# Patient Record
Sex: Female | Born: 1945 | Race: White | Hispanic: No | Smoking: Former smoker
Health system: Southern US, Community
[De-identification: ages and names within clinical notes are randomized; demographics above are authoritative.]

## PROBLEM LIST (undated history)

## (undated) DIAGNOSIS — M069 Rheumatoid arthritis, unspecified: Secondary | ICD-10-CM

## (undated) HISTORY — PX: APPENDECTOMY: SHX54

## (undated) HISTORY — PX: ABDOMINAL HYSTERECTOMY: SHX81

---

## 2020-01-07 ENCOUNTER — Emergency Department (HOSPITAL_COMMUNITY): Payer: Medicare HMO

## 2020-01-07 ENCOUNTER — Encounter (HOSPITAL_COMMUNITY): Payer: Self-pay

## 2020-01-07 ENCOUNTER — Other Ambulatory Visit: Payer: Self-pay

## 2020-01-07 ENCOUNTER — Emergency Department (HOSPITAL_COMMUNITY)
Admission: EM | Admit: 2020-01-07 | Discharge: 2020-01-07 | Disposition: A | Discharge status: Home / Self Care | Payer: Medicare HMO | Attending: Emergency Medicine | Admitting: Emergency Medicine

## 2020-01-07 DIAGNOSIS — Z853 Personal history of malignant neoplasm of breast: Secondary | ICD-10-CM | POA: Insufficient documentation

## 2020-01-07 DIAGNOSIS — Z87891 Personal history of nicotine dependence: Secondary | ICD-10-CM | POA: Insufficient documentation

## 2020-01-07 DIAGNOSIS — K5900 Constipation, unspecified: Secondary | ICD-10-CM | POA: Diagnosis not present

## 2020-01-07 DIAGNOSIS — R109 Unspecified abdominal pain: Secondary | ICD-10-CM | POA: Diagnosis present

## 2020-01-07 DIAGNOSIS — M7981 Nontraumatic hematoma of soft tissue: Secondary | ICD-10-CM | POA: Diagnosis not present

## 2020-01-07 DIAGNOSIS — R110 Nausea: Secondary | ICD-10-CM | POA: Insufficient documentation

## 2020-01-07 HISTORY — DX: Rheumatoid arthritis, unspecified: M06.9

## 2020-01-07 LAB — URINALYSIS, ROUTINE W REFLEX MICROSCOPIC
Bilirubin Urine: NEGATIVE
Glucose, UA: NEGATIVE mg/dL
Hgb urine dipstick: NEGATIVE
Ketones, ur: NEGATIVE mg/dL
Leukocytes,Ua: NEGATIVE
Nitrite: NEGATIVE
Protein, ur: NEGATIVE mg/dL
Specific Gravity, Urine: 1.032 — ABNORMAL HIGH (ref 1.005–1.030)
pH: 6 (ref 5.0–8.0)

## 2020-01-07 LAB — COMPREHENSIVE METABOLIC PANEL
ALT: 18 U/L (ref 0–44)
AST: 45 U/L — ABNORMAL HIGH (ref 15–41)
Albumin: 4.1 g/dL (ref 3.5–5.0)
Alkaline Phosphatase: 167 U/L — ABNORMAL HIGH (ref 38–126)
Anion gap: 8 (ref 5–15)
BUN: 12 mg/dL (ref 8–23)
CO2: 25 mmol/L (ref 22–32)
Calcium: 9.3 mg/dL (ref 8.9–10.3)
Chloride: 97 mmol/L — ABNORMAL LOW (ref 98–111)
Creatinine, Ser: 0.57 mg/dL (ref 0.44–1.00)
GFR, Estimated: >60 mL/min (ref >60)
Glucose, Bld: 101 mg/dL — ABNORMAL HIGH (ref 70–99)
Potassium: 3.8 mmol/L (ref 3.5–5.1)
Sodium: 130 mmol/L — ABNORMAL LOW (ref 135–145)
Total Bilirubin: 0.4 mg/dL (ref 0.3–1.2)
Total Protein: 7.1 g/dL (ref 6.5–8.1)

## 2020-01-07 LAB — CBC WITH DIFFERENTIAL/PLATELET
Abs Immature Granulocytes: 0.11 10*3/uL — ABNORMAL HIGH (ref 0.00–0.07)
Basophils Absolute: 0.1 10*3/uL (ref 0.0–0.1)
Basophils Relative: 1 %
Eosinophils Absolute: 0 10*3/uL (ref 0.0–0.5)
Eosinophils Relative: 0 %
HCT: 38.8 % (ref 36.0–46.0)
Hemoglobin: 13.1 g/dL (ref 12.0–15.0)
Immature Granulocytes: 1 %
Lymphocytes Relative: 8 %
Lymphs Abs: 0.8 10*3/uL (ref 0.7–4.0)
MCH: 31.3 pg (ref 26.0–34.0)
MCHC: 33.8 g/dL (ref 30.0–36.0)
MCV: 92.8 fL (ref 80.0–100.0)
Monocytes Absolute: 0.7 10*3/uL (ref 0.1–1.0)
Monocytes Relative: 7 %
Neutro Abs: 8.9 10*3/uL — ABNORMAL HIGH (ref 1.7–7.7)
Neutrophils Relative %: 83 %
Platelets: 351 10*3/uL (ref 150–400)
RBC: 4.18 MIL/uL (ref 3.87–5.11)
RDW: 14.9 % (ref 11.5–15.5)
WBC: 10.6 10*3/uL — ABNORMAL HIGH (ref 4.0–10.5)
nRBC: 0 % (ref 0.0–0.2)

## 2020-01-07 LAB — LIPASE, BLOOD: Lipase: 26 U/L (ref 11–51)

## 2020-01-07 MED ORDER — OXYCODONE-ACETAMINOPHEN 5-325 MG PO TABS
1.0000 | ORAL_TABLET | Freq: Once | ORAL | Status: AC
  Administered 2020-01-07: 1 via ORAL
  Filled 2020-01-07: qty 1

## 2020-01-07 MED ORDER — ONDANSETRON HCL 4 MG/2ML IJ SOLN
4.0000 mg | Freq: Once | INTRAMUSCULAR | Status: AC
  Administered 2020-01-07: 4 mg via INTRAVENOUS
  Filled 2020-01-07: qty 2

## 2020-01-07 MED ORDER — ONDANSETRON 4 MG PO TBDP: 4.0000 mg | ORAL_TABLET | Freq: Three times a day (TID) | ORAL | 0 refills | Status: AC | PRN

## 2020-01-07 MED ORDER — ONDANSETRON 4 MG PO TBDP: 4.0000 mg | ORAL_TABLET | Freq: Once | ORAL | Status: DC

## 2020-01-07 MED ORDER — MORPHINE SULFATE (PF) 4 MG/ML IV SOLN
4.0000 mg | Freq: Once | INTRAVENOUS | Status: AC
  Administered 2020-01-07: 4 mg via INTRAVENOUS
  Filled 2020-01-07: qty 1

## 2020-01-07 MED ORDER — OXYCODONE HCL 5 MG PO TABS
5.0000 mg | ORAL_TABLET | Freq: Four times a day (QID) | ORAL | 0 refills | Status: AC | PRN
Start: 2020-01-07 — End: 2020-01-22

## 2020-01-07 MED ORDER — ACETAMINOPHEN 325 MG PO TABS: 650.0000 mg | ORAL_TABLET | Freq: Four times a day (QID) | ORAL | 0 refills | Status: AC | PRN

## 2020-01-07 MED ORDER — IOHEXOL 350 MG/ML SOLN
75.0000 mL | Freq: Once | INTRAVENOUS | Status: AC | PRN
  Administered 2020-01-07: 75 mL via INTRAVENOUS

## 2020-01-07 MED ORDER — SENNOSIDES-DOCUSATE SODIUM 8.6-50 MG PO TABS: 1.0000 | ORAL_TABLET | Freq: Every day | ORAL | 0 refills | Status: AC

## 2020-01-07 NOTE — ED Provider Notes (Signed)
[facility] EMERGENCY DEPARTMENT Provider Note   CSN: [id number] Arrival date & time: 01/07/20  1522     History Chief Complaint  Patient presents with  . Abdominal Pain    Heather Hughes is a 74 y.o. female with history of stage III breast cancer status post mastectomy, on oral hormone therapy, history of appendectomy and abdominal hysterectomy, presenting to emergency department abdominal pain.  Patient reports gradual onset of abdominal pain approximately 3 weeks ago.  The pain does wax and wane, at times goes away completely, but seems to return every single day.  It is worsened with movements, particularly with sitting up and with walking.  Nothing makes it better, including Tylenol or Motrin at home.  The patient denies to me that she has had any diarrhea, reports that she has some chronic constipation issues, but has been having regular bowel movements this week.  She denies ever having this kind of abdominal pain before.  Currently the pain is a 4 out of 10 while lying still, but becomes significantly worse when he tried to sit up or move.  She reports she suffers from urinary incontinence, but denies history of recurrent urine infections or kidney stones.  Her daughter at bedside who is a phlebotomist reports that she is most concerned that there may be malignancy that has metastasized to the abdomen.  She says her mother had breast cancer with lymph node involvement in the axilla, and did have cancer screening performed earlier this year, which was "reassuring".  Her oncologist is in [place], where the patient lives.  No fevers, chills, vomiting at home.  + Nausea.  HPI     Past Medical History:  Diagnosis Date  . Rheumatoid arthritis ([place])     There are no problems to display for this patient.   Past Surgical History:  Procedure Laterality Date  . ABDOMINAL HYSTERECTOMY    . APPENDECTOMY       OB History   No obstetric history on file.     History  reviewed. No pertinent family history.  Social History   Tobacco Use  . Smoking status: Former Smoker    Types: Cigarettes    Quit date: 02/12/1996    Years since quitting: 23.9  . Smokeless tobacco: Never Used  Substance Use Topics  . Alcohol use: Yes  . Drug use: Never    Home Medications Prior to Admission medications   Medication Sig Start Date End Date Taking? Authorizing Provider  acetaminophen (TYLENOL) 325 MG tablet Take 2 tablets (650 mg total) by mouth every 6 (six) hours as needed for up to 30 doses for mild pain or moderate pain. 01/07/20   Wyvonnia Dusky, MD  ondansetron (ZOFRAN ODT) 4 MG disintegrating tablet Take 1 tablet (4 mg total) by mouth every 8 (eight) hours as needed for up to 15 doses for nausea or vomiting. 01/07/20   Wyvonnia Dusky, MD  oxyCODONE (ROXICODONE) 5 MG immediate release tablet Take 1 tablet (5 mg total) by mouth every 6 (six) hours as needed for up to 15 days for severe pain. 01/07/20 01/22/20  Wyvonnia Dusky, MD  senna-docusate (SENOKOT-S) 8.6-50 MG tablet Take 1 tablet by mouth daily for 30 doses. 01/07/20 02/06/20  Wyvonnia Dusky, MD    Allergies    Patient has no known allergies.  Review of Systems   Review of Systems  Constitutional: Positive for appetite change and fatigue. Negative for fever.  HENT: Negative for ear pain and sore  throat.   Eyes: Negative for photophobia and visual disturbance.  Respiratory: Negative for cough and shortness of breath.   Cardiovascular: Negative for chest pain and palpitations.  Gastrointestinal: Positive for abdominal pain and nausea. Negative for blood in stool, constipation, diarrhea and vomiting.  Genitourinary: Negative for dysuria and hematuria.  Musculoskeletal: Negative for arthralgias and back pain.  Skin: Negative for color change and rash.  Neurological: Negative for syncope and light-headedness.  All other systems reviewed and are negative.   Physical Exam Updated Vital  Signs BP (!) 145/77   Pulse 85   Temp 98.2 F (36.8 C) (Oral)   Resp 17   Ht 5' 3" (1.6 m)   Wt 42.9 kg   SpO2 98%   BMI 16.74 kg/m   Physical Exam Vitals and nursing note reviewed.  Constitutional:      General: She is not in acute distress.    Comments: Thin, frail appearing  HENT:     Head: Normocephalic and atraumatic.  Eyes:     Conjunctiva/sclera: Conjunctivae normal.  Cardiovascular:     Rate and Rhythm: Normal rate and regular rhythm.     Heart sounds: Normal heart sounds.  Pulmonary:     Effort: Pulmonary effort is normal. No respiratory distress.     Breath sounds: Normal breath sounds.  Abdominal:     Palpations: Abdomen is soft.     Tenderness: There is abdominal tenderness in the right upper quadrant and epigastric area. There is no right CVA tenderness, left CVA tenderness, guarding or rebound. Negative signs include Murphy's sign.  Musculoskeletal:     Cervical back: Neck supple.  Skin:    General: Skin is warm and dry.     Comments: S/p mastectomy  Neurological:     General: No focal deficit present.     Mental Status: She is alert and oriented to person, place, and time.     Comments: No spinal midline tenderness on back exam Mild right CVA ttp     ED Results / Procedures / Treatments   Labs (all labs ordered are listed, but only abnormal results are displayed) Labs Reviewed  COMPREHENSIVE METABOLIC PANEL - Abnormal; Notable for the following components:      Result Value   Sodium 130 (*)    Chloride 97 (*)    Glucose, Bld 101 (*)    AST 45 (*)    Alkaline Phosphatase 167 (*)    All other components within normal limits  CBC WITH DIFFERENTIAL/PLATELET - Abnormal; Notable for the following components:   WBC 10.6 (*)    Neutro Abs 8.9 (*)    Abs Immature Granulocytes 0.11 (*)    All other components within normal limits  URINALYSIS, ROUTINE W REFLEX MICROSCOPIC - Abnormal; Notable for the following components:   Color, Urine STRAW (*)     Specific Gravity, Urine 1.032 (*)    All other components within normal limits  LIPASE, BLOOD    EKG None  Radiology DG Chest 2 View  Result Date: 01/07/2020 CLINICAL DATA:  74 year old female with pneumonia. EXAM: CHEST - 2 VIEW COMPARISON:  CT abdomen pelvis dated 01/07/2020. FINDINGS: No focal consolidation, pleural effusion, pneumothorax. The cardiac silhouette is within limits. Atherosclerotic calcification of the aorta. Osteopenia with degenerative changes of the spine. Old left posterior rib fractures. No acute osseous pathology. IMPRESSION: No active cardiopulmonary disease. Electronically Signed   By: Anner Crete M.D.   On: 01/07/2020 18:11   CT Angio Abd/Pel W and/or Wo  Contrast  Result Date: 01/07/2020 CLINICAL DATA:  Abdominal pain and nausea with back pain. EXAM: CTA ABDOMEN AND PELVIS WITH CONTRAST TECHNIQUE: Multidetector CT imaging of the abdomen and pelvis was performed using the standard protocol during bolus administration of intravenous contrast. Multiplanar reconstructed images and MIPs were obtained and reviewed to evaluate the vascular anatomy. CONTRAST:  90m OMNIPAQUE IOHEXOL 350 MG/ML SOLN COMPARISON:  None. FINDINGS: VASCULAR Aorta: Considerable atherosclerosis. No dissection or well-defined acute extravasation of contrast to suggest a leak, but there is some indistinctness of tissue planes in the retroperitoneum especially along the aortocaval region. Some of this is partially attributable to the low-density in the cava causing indistinct margins. There does appear to be thickening and possible hematoma tracking along the right psoas muscle and along the right perirenal fascia and potentially in the retroperitoneum. Celiac: Patent. Atherosclerotic calcification of the splenic artery. SMA: Patent Renals: 2 left and single right renal arteries appear patent. IMA: Patent. Inflow: Atherosclerotic calcification without critical stenosis. Proximal Outflow: Atherosclerosis  without significant stenosis. Veins: IVC is somewhat indistinct. No obvious pelvic DVT although the veins are very poorly opacified on these arterial phase images. Review of the MIP images confirms the above findings. NON-VASCULAR Lower chest: Scarring or atelectasis in the lingula and right middle lobe. Hepatobiliary: Unremarkable Pancreas: Unremarkable Spleen: Unremarkable Adrenals/Urinary Tract: Mild right hydronephrosis. No hydroureter, query chronic right UPJ narrowing. Mild thickening along the posterior perirenal fascia Stomach/Bowel: Unremarkable Lymphatic: No pathologic adenopathy identified. Reproductive: Obscured by streak artifact from the hip hardware. Other: Trace ascites along the paracolic gutters. Edema tracks along the retroperitoneum and perirenal fascia. Musculoskeletal: Bony demineralization. Left hip ORIF and right hip prosthesis. Old healed pelvic fractures. Scattered sclerotic lesions in the skeleton, suspicious for osseous metastatic disease, correlate with patient history. These are notable for example in the T12 and L3 vertebral bodies and in the posterior elements of L2. As well as scattered in the bony pelvis. IMPRESSION: 1. No dissection or well-defined acute extravasation of contrast to suggest a leak, but there is some indistinctness of tissue planes in the retroperitoneum especially along the aortocaval region, and also in the right perirenal fascia. There is also some thickening and possible hematoma tracking along the right psoas muscle and along the right perirenal fascia. Given the mild thickening along the right psoas muscle I cannot exclude a small psoas hematoma. 2. Mild right hydronephrosis without hydroureter, query chronic right UPJ narrowing. 3. Scattered sclerotic lesions in the skeleton, suspicious for widespread osseous metastatic disease. 4. Trace ascites along the paracolic gutters. 5. Bony demineralization. 6. Aortic atherosclerosis. Aortic Atherosclerosis  (ICD10-I70.0). Electronically Signed   By: WVan ClinesM.D.   On: 01/07/2020 18:07    Procedures Procedures (including critical care time)  Medications Ordered in ED Medications  ondansetron (ZOFRAN-ODT) disintegrating tablet 4 mg (has no administration in time range)  morphine 4 MG/ML injection 4 mg (4 mg Intravenous Given 01/07/20 1642)  ondansetron (ZOFRAN) injection 4 mg (4 mg Intravenous Given 01/07/20 1641)  iohexol (OMNIPAQUE) 350 MG/ML injection 75 mL (75 mLs Intravenous Contrast Given 01/07/20 1720)  oxyCODONE-acetaminophen (PERCOCET/ROXICET) 5-325 MG per tablet 1 tablet (1 tablet Oral Given 01/07/20 2052)    ED Course  I have reviewed the triage vital signs and the nursing notes.  Pertinent labs & imaging results that were available during my care of the patient were reviewed by me and considered in my medical decision making (see chart for details).  This patient presents to the Emergency Department with complaint  of abdominal pain and back pain. This involves an extensive number of treatment options, and is a complaint that carries with it a high risk of complications and morbidity.  The differential diagnosis includes gastritis vs biliary disease vs metastatic lesions vs aortic pathology vs constipation vs inflammatory bowel disease vs intraabdominal infection vs other  S/p appendectomy & hysterectomy  I ordered, reviewed, and interpreted labs, including lipase w6, CMP largely unremarkable, CBC with WBC 10.6, hgb 13.1.  Alk phos 167 today, consistent with possible bony mets seen on CT imaging. I ordered medication IV morphine and IV zofran for abdominal pain and/or nausea I ordered imaging studies which included dg chest and ct abd I independently visualized and interpreted imaging which showed no acute pulm process, CT findings as noted, and the monitor tracing which showed NSR Additional history was obtained from daughter at bedside  After the interventions stated  above, I reevaluated the patient and found that they remained clinically stable.  I discussed CT findings with patient and daughter.   Suspect back pain may be from psoas hematoma (possible due to unwitting trauma 3 weeks ago?) vs bony mets to spine.  Advised them to f/u with oncologist.  Offered some pain medications for home.  She's been on oxycodone recently for an arm fracture and seems to tolerate it well at home.   Final Clinical Impression(s) / ED Diagnoses Final diagnoses:  Nontraumatic psoas hematoma    Rx / DC Orders ED Discharge Orders         Ordered    oxyCODONE (ROXICODONE) 5 MG immediate release tablet  Every 6 hours PRN        01/07/20 2041    acetaminophen (TYLENOL) 325 MG tablet  Every 6 hours PRN        01/07/20 2041    senna-docusate (SENOKOT-S) 8.6-50 MG tablet  Daily        01/07/20 2041    ondansetron (ZOFRAN ODT) 4 MG disintegrating tablet  Every 8 hours PRN        01/07/20 2041           Wyvonnia Dusky, MD 01/08/20 [id number]

## 2020-01-07 NOTE — ED Triage Notes (Signed)
Pt to er, pt states that for the past three weeks she has been having some L flank pain that radiates into her back and abd.  States that it is a constant pain.  States that she had her blood drawn about 4 weeks ago and had some elevated liver enzymes.

## 2020-01-07 NOTE — Discharge Instructions (Addendum)
Your CT scan today showed that you have a hematoma, or collection of blood, long one of the muscles in your back.  This may be causing some of your back pain.  However your CT scan also showed that you have some "lesions in your spine."  There is some concern that this may be cancer.  I recommended that you follow-up with your oncologist, or if you are interested in transferring, call the number above to establish care with one of our oncologist.  Oxycodone can cause constipation.  While you are taking oxycodone, you can also take Senokot for constipation.

## 2020-03-14 DEATH — deceased

## 2021-10-30 IMAGING — DX DG CHEST 2V
2 series · 2 of 2 positions shown · non-contrast
Comparison: CT abdomen pelvis dated 01/07/2020.

CLINICAL DATA: 74-year-old female with pneumonia.

EXAM:
CHEST - 2 VIEW

[chest lat]
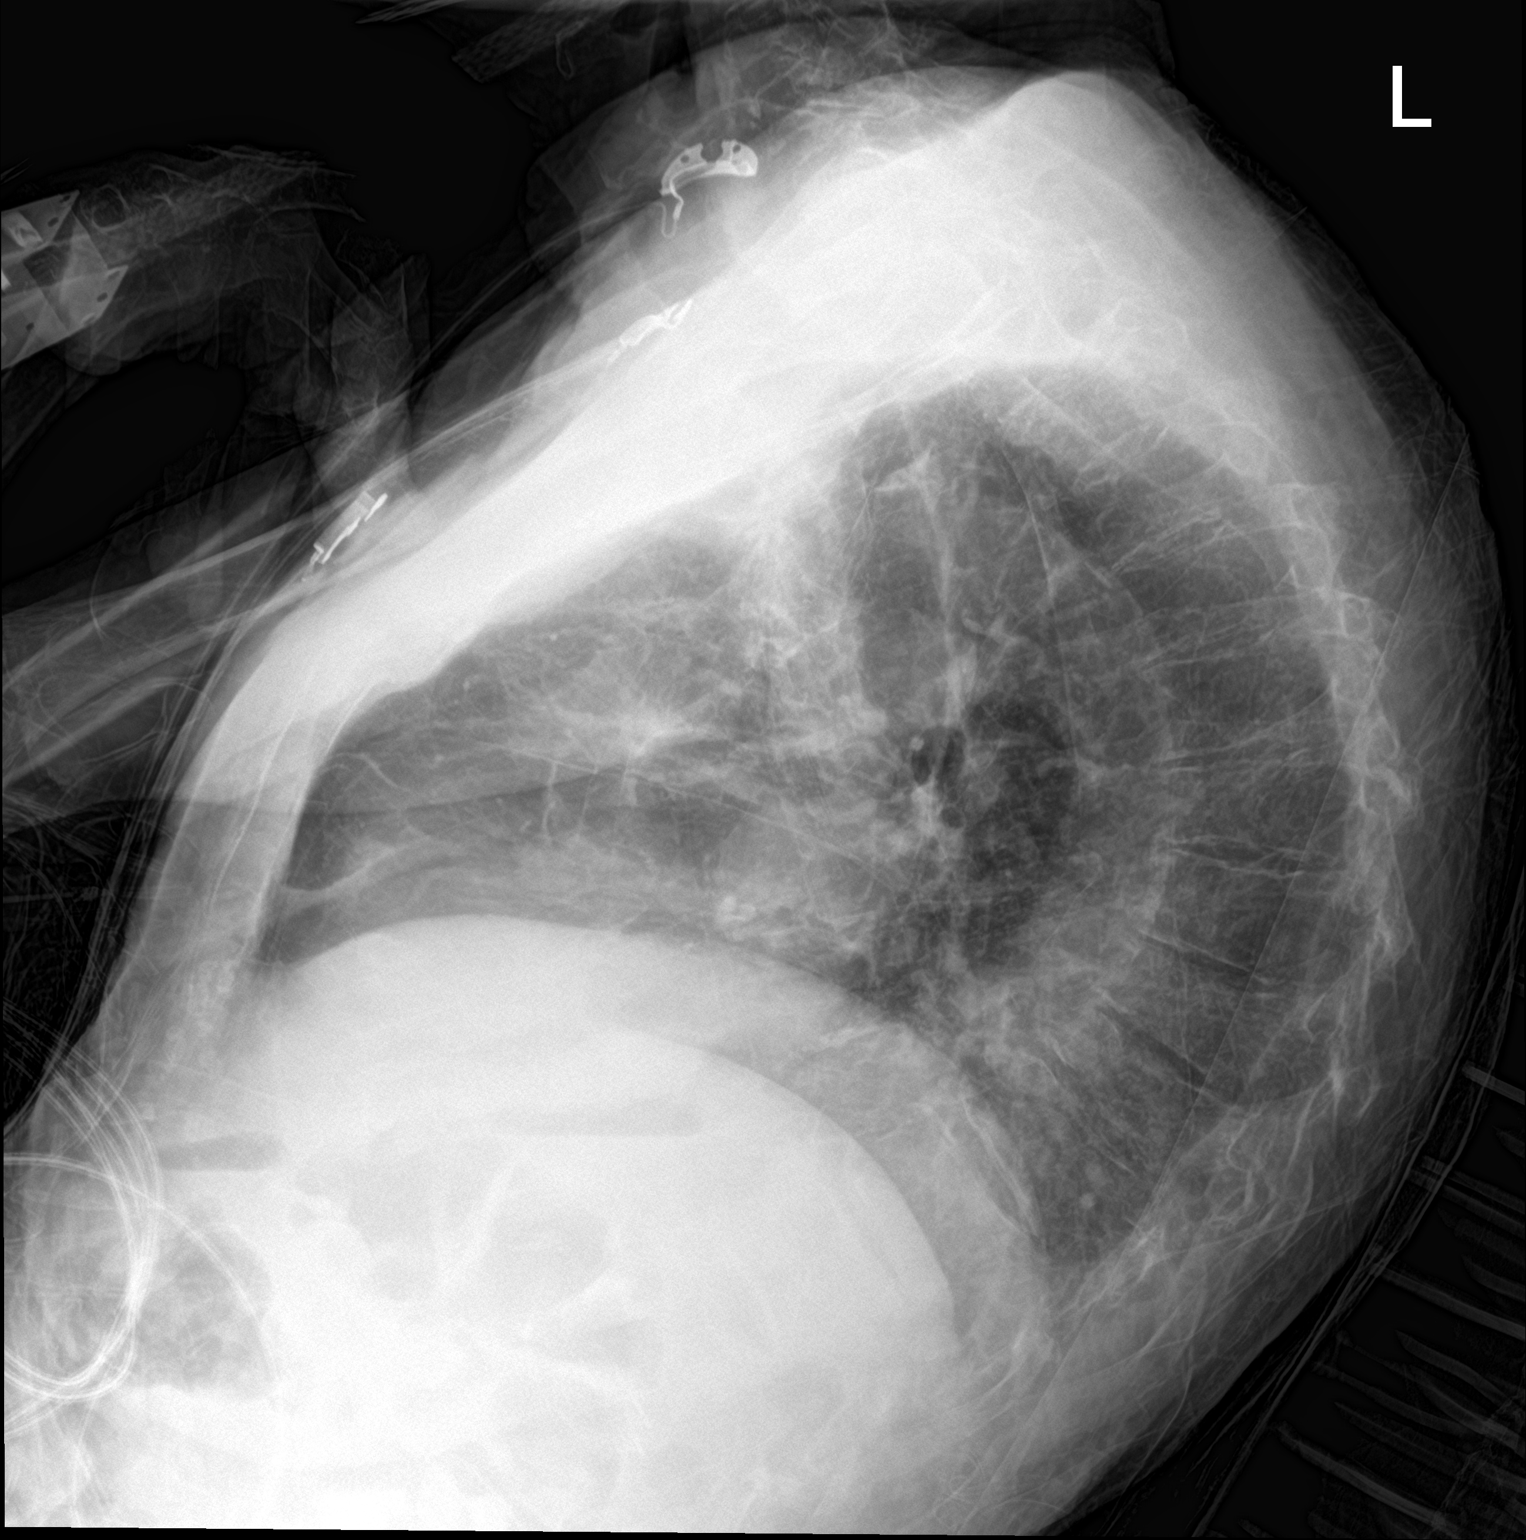

[chest ap]
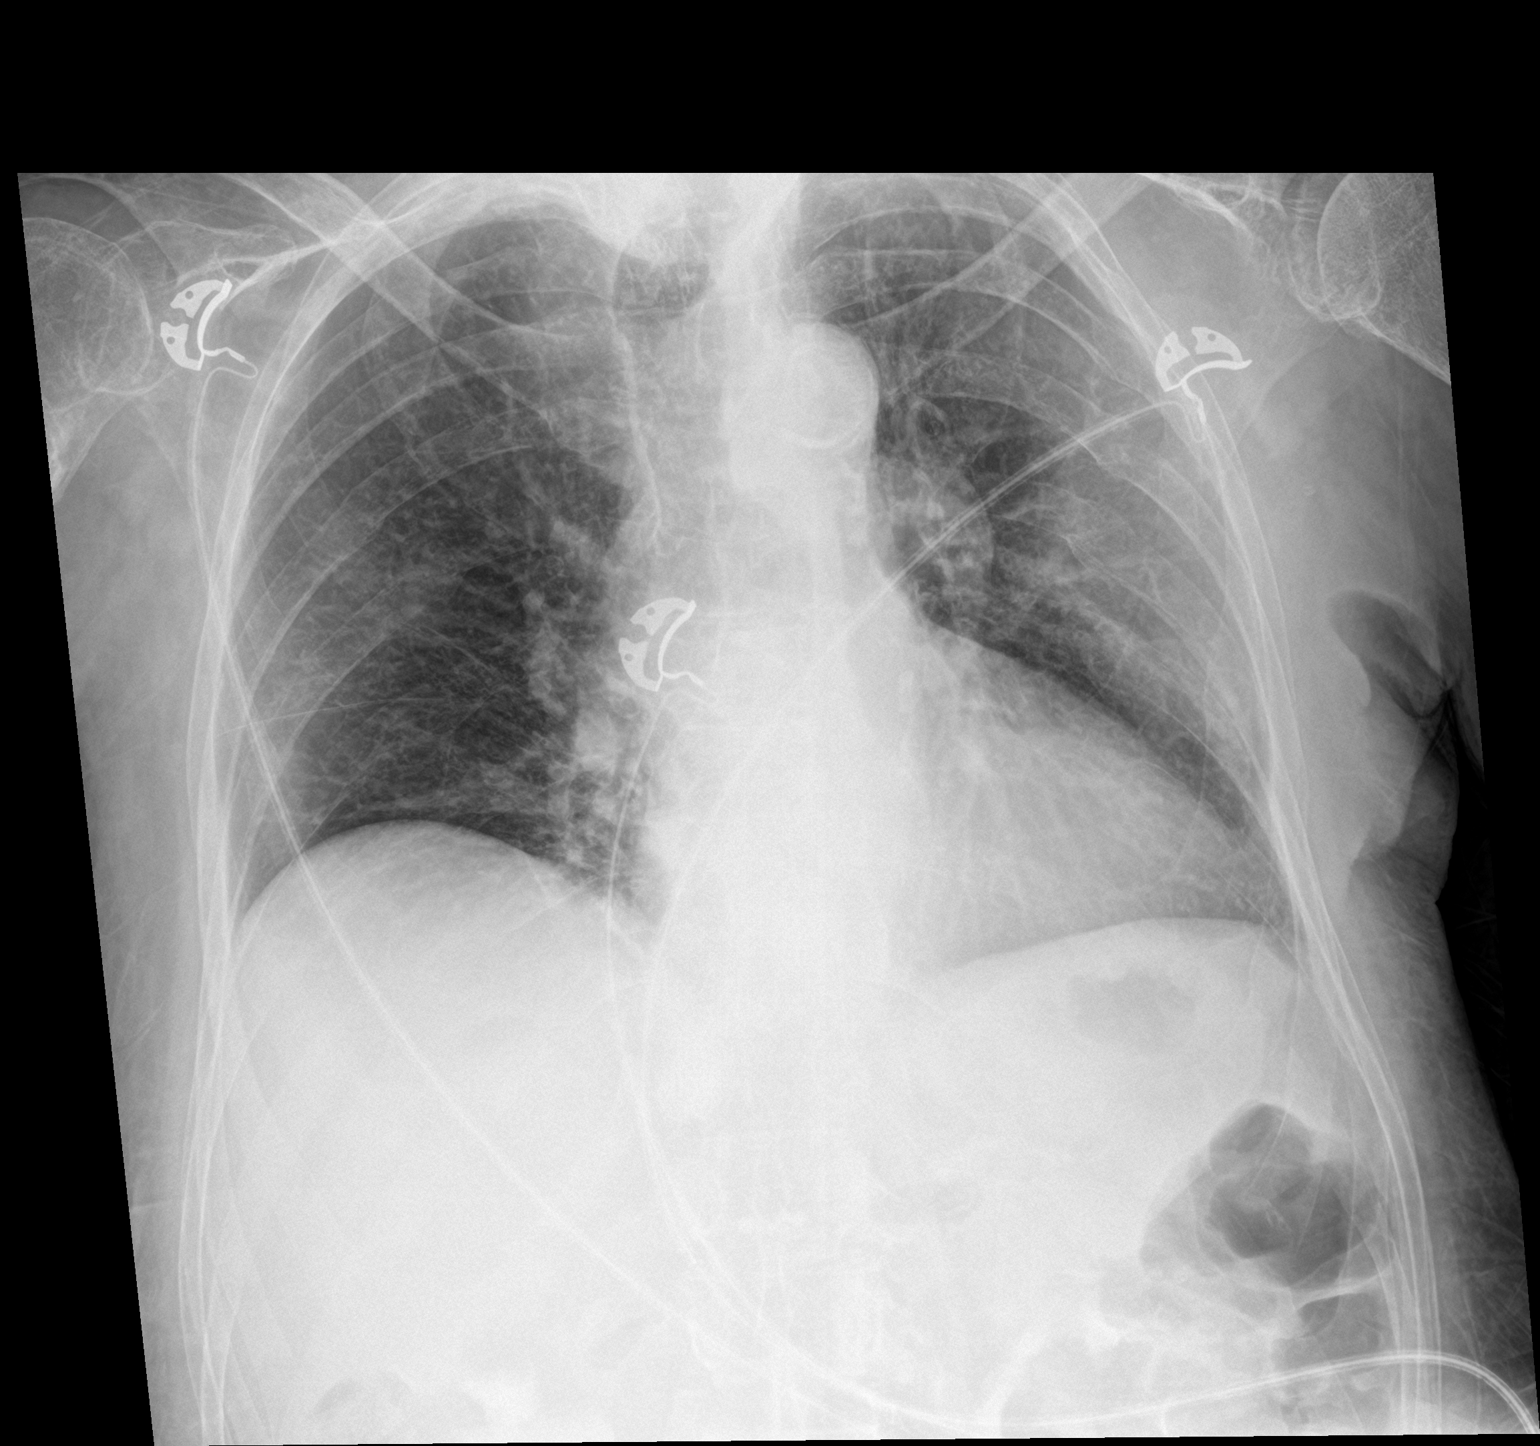

[2 of 2 positions shown; findings below may reference images not displayed]

FINDINGS: No focal consolidation, pleural effusion, pneumothorax. The cardiac
silhouette is within limits. Atherosclerotic calcification of the
aorta. Osteopenia with degenerative changes of the spine. Old left
posterior rib fractures. No acute osseous pathology.
IMPRESSION: No active cardiopulmonary disease.
